# Patient Record
Sex: Male | Born: 2000 | Race: Black or African American | Hispanic: No | Marital: Single | Smoking: Never smoker
Health system: Southern US, Community
[De-identification: ages and names within clinical notes are randomized; demographics above are authoritative.]

## PROBLEM LIST (undated history)

## (undated) HISTORY — PX: OTHER SURGICAL HISTORY: SHX169

---
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
## 2021-10-06 ENCOUNTER — Emergency Department (HOSPITAL_COMMUNITY)
Admission: EM | Admit: 2021-10-06 | Discharge: 2021-10-06 | Disposition: A | Discharge status: Home / Self Care | Payer: Medicaid Other | Attending: Emergency Medicine | Admitting: Emergency Medicine

## 2021-10-06 ENCOUNTER — Other Ambulatory Visit: Payer: Self-pay

## 2021-10-06 ENCOUNTER — Emergency Department (HOSPITAL_COMMUNITY): Payer: Medicaid Other

## 2021-10-06 ENCOUNTER — Encounter (HOSPITAL_COMMUNITY): Payer: Self-pay

## 2021-10-06 DIAGNOSIS — X500XXA Overexertion from strenuous movement or load, initial encounter: Secondary | ICD-10-CM | POA: Diagnosis not present

## 2021-10-06 DIAGNOSIS — M25532 Pain in left wrist: Secondary | ICD-10-CM | POA: Insufficient documentation

## 2021-10-06 DIAGNOSIS — G8911 Acute pain due to trauma: Secondary | ICD-10-CM | POA: Diagnosis not present

## 2021-10-06 NOTE — ED Provider Notes (Signed)
[facility] EMERGENCY DEPARTMENT Provider Note   CSN: [id number] Arrival date & time: 10/06/21  1212     History  Chief Complaint  Patient presents with   Wrist Pain    Henry Quinn is a 21 y.o. male who presents today for evaluation of left wrist pain.  He states that while he was at the gym yesterday and doing curls he felt a twinge of pain in his left wrist.  This is worsened since then.  He states that he does not have any numbness.  The pain is made worse with rotating his wrist.  HPI     Home Medications Prior to Admission medications   Medication Sig Start Date End Date Taking? Authorizing Provider  dicyclomine (BENTYL) 20 MG tablet Take 1 tablet (20 mg total) by mouth 2 (two) times daily. Patient not taking: Reported on 04/25/2019 01/27/16   Antony Madura, PA-C  ondansetron (ZOFRAN ODT) 4 MG disintegrating tablet Take 1 tablet (4 mg total) by mouth every 8 (eight) hours as needed for nausea or vomiting. Patient not taking: Reported on 04/25/2019 01/27/16   Antony Madura, PA-C      Allergies    Patient has no known allergies.    Review of Systems   Review of Systems See HPI Physical Exam Updated Vital Signs BP (!) 141/82 (BP Location: Right Arm)    Pulse 78    Temp 98.6 F (37 C) (Oral)    Resp 17    SpO2 98%  Physical Exam Vitals and nursing note reviewed.  Constitutional:      General: He is not in acute distress. HENT:     Head: Normocephalic and atraumatic.  Cardiovascular:     Rate and Rhythm: Normal rate.     Comments: 2+ left radial pulse. Pulmonary:     Effort: Pulmonary effort is normal. No respiratory distress.  Musculoskeletal:     Cervical back: No rigidity.     Comments: Mild tenderness to palpation over the ulnar aspect of the wrist.  There is no crepitus or deformity noted.  No significant edema.  He is able to flex and extend the wrist and fingers.  He has pain with pronation and supination of the left wrist.  Skin:     General: Skin is warm and dry.     Comments: No abrasions, skin breaks or abnormal coloration noted over the left wrist.  Neurological:     Mental Status: He is alert. Mental status is at baseline.     Sensory: No sensory deficit.     Comments: Awake and alert, answers all questions appropriately.  Speech is not slurred.    Psychiatric:        Mood and Affect: Mood normal.    ED Results / Procedures / Treatments   Labs (all labs ordered are listed, but only abnormal results are displayed) Labs Reviewed - No data to display  EKG None  Radiology DG Wrist Complete Left  Result Date: 10/06/2021 CLINICAL DATA:  Acute left wrist pain after injury. EXAM: LEFT WRIST - COMPLETE 3+ VIEW COMPARISON:  None. FINDINGS: There is no evidence of fracture or dislocation. There is no evidence of arthropathy or other focal bone abnormality. Soft tissues are unremarkable. IMPRESSION: Negative. Electronically Signed   By: Lupita Raider M.D.   On: 10/06/2021 13:08    Procedures Procedures    Medications Ordered in ED Medications - No data to display  ED Course/ Medical Decision Making/ A&P  Medical Decision Making  Patient is a 21 year old who presents today for evaluation of left wrist pain that started after he was curling and felt pain in his left wrist.  X-ray without fracture, dislocation or other abnormality.  He is neurovascularly intact on exam, He is given an Ace wrap. Labs are not indicated. Recommended RICE.  Conservative care.  Work note is given.  He is given hand follow-up if his symptoms fail to improve, instructed on OTC meds.  Return precautions were discussed with patient who states their understanding.  At the time of discharge patient denied any unaddressed complaints or concerns.  Patient is agreeable for discharge home.  Note: Portions of this report may have been transcribed using voice recognition software. Every effort was made to ensure  accuracy; however, inadvertent computerized transcription errors may be present    Final Clinical Impression(s) / ED Diagnoses Final diagnoses:  Left wrist pain    Rx / DC Orders ED Discharge Orders     None         Norman Clay 10/06/21 1623    Gloris Manchester, MD 10/06/21 2006

## 2021-10-06 NOTE — ED Triage Notes (Signed)
Patient states he was at the gym yesterday and his left wrist started hurting. Unable to rotate all the way but can bend it up and down.

## 2021-10-06 NOTE — ED Provider Triage Note (Signed)
Emergency Medicine Provider Triage Evaluation Note  Henry Quinn , a 21 y.o. male  was evaluated in triage.  Pt complains of left wrist pain after lifting at the gym PTA. Patient is right hand dominant.   Review of Systems  Positive: arthralgia Negative: fever  Physical Exam  BP (!) 141/82 (BP Location: Right Arm)    Pulse 78    Temp 98.6 F (37 C) (Oral)    Resp 17    SpO2 98%  Gen:   Awake, no distress   Resp:  Normal effort  MSK:   Moves extremities without difficulty  Other:  Radial pulse intact  Medical Decision Making  Medically screening exam initiated at 12:39 PM.  Appropriate orders placed.  Huntter Oguinn was informed that the remainder of the evaluation will be completed by another provider, this initial triage assessment does not replace that evaluation, and the importance of remaining in the ED until their evaluation is complete.  X-ray to rule out bony fracture   Suzy Bouchard, PA-C 10/06/21 1240

## 2021-10-06 NOTE — Discharge Instructions (Addendum)
You can use the Ace wrap as needed to help with pain and discomfort. I have given you the information for an orthopedic specialist/bone specialist.  If your pain does not get better in the next week please schedule a follow-up appointment with them. I would recommend not using your left arm at the gym for the next week.  After that you can start to slowly return to your usual activities if you are not having pain.  If you have any pain you need to stop doing these activities and allow more time for your wrist to heal.  While in the ED your blood pressure was high.  Please follow up with your primary care doctor or the wellness clinic for repeat evaluation as you may need medication.  High blood pressure can cause long term, potentially serious, damage if left untreated.    Please take Ibuprofen (Advil, motrin) and Tylenol (acetaminophen) to relieve your pain.    You may take up to 600 MG (3 pills) of normal strength ibuprofen every 8 hours as needed.   You make take tylenol, up to 1,000 mg (two extra strength pills) every 8 hours as needed.   It is safe to take ibuprofen and tylenol at the same time as they work differently.   Do not take more than 3,000 mg tylenol in a 24 hour period (not more than one dose every 8 hours.  Please check all medication labels as many medications such as pain and cold medications may contain tylenol.  Do not drink alcohol while taking these medications.  Do not take other NSAID'S while taking ibuprofen (such as aleve or naproxen).  Please take ibuprofen with food to decrease stomach upset.

## 2021-10-07 ENCOUNTER — Telehealth: Payer: Self-pay

## 2021-10-07 NOTE — Telephone Encounter (Signed)
Transition Care Management Follow-up Telephone Call Date of discharge and from where: 10/06/2021-[facility] How have you been since you were released from the hospital? Patient stated her is doing fine.  Any questions or concerns? No  Items Reviewed: Did the pt receive and understand the discharge instructions provided? Yes  Medications obtained and verified? Yes  Other? No  Any new allergies since your discharge? No  Dietary orders reviewed? No Do you have support at home? Yes   Home Care and Equipment/Supplies: Were home health services ordered? not applicable If so, what is the name of the agency? N/A  Has the agency set up a time to come to the patient's home? not applicable Were any new equipment or medical supplies ordered?  No What is the name of the medical supply agency? N/A Were you able to get the supplies/equipment? not applicable Do you have any questions related to the use of the equipment or supplies? No  Functional Questionnaire: (I = Independent and D = Dependent) ADLs: I  Bathing/Dressing- I  Meal Prep- I  Eating- I  Maintaining continence- I  Transferring/Ambulation- I  Managing Meds- I  Follow up appointments reviewed:  PCP Hospital f/u appt confirmed? No   Specialist Hospital f/u appt confirmed? No   Are transportation arrangements needed? No  If their condition worsens, is the pt aware to call PCP or go to the Emergency Dept.? Yes Was the patient provided with contact information for the PCP's office or ED? Yes Was to pt encouraged to call back with questions or concerns? Yes

## 2021-11-08 ENCOUNTER — Ambulatory Visit (INDEPENDENT_AMBULATORY_CARE_PROVIDER_SITE_OTHER): Payer: Medicaid Other

## 2021-11-08 ENCOUNTER — Emergency Department (HOSPITAL_BASED_OUTPATIENT_CLINIC_OR_DEPARTMENT_OTHER)
Admission: EM | Admit: 2021-11-08 | Discharge: 2021-11-08 | Disposition: A | Discharge status: Home / Self Care | Payer: Medicaid Other | Attending: Emergency Medicine | Admitting: Emergency Medicine

## 2021-11-08 ENCOUNTER — Encounter (HOSPITAL_BASED_OUTPATIENT_CLINIC_OR_DEPARTMENT_OTHER): Payer: Self-pay | Admitting: Emergency Medicine

## 2021-11-08 ENCOUNTER — Encounter (HOSPITAL_COMMUNITY): Payer: Self-pay | Admitting: *Deleted

## 2021-11-08 ENCOUNTER — Ambulatory Visit (HOSPITAL_COMMUNITY)
Admission: EM | Admit: 2021-11-08 | Discharge: 2021-11-08 | Disposition: A | Discharge status: Home / Self Care | Payer: Medicaid Other | Attending: Family Medicine | Admitting: Family Medicine

## 2021-11-08 ENCOUNTER — Other Ambulatory Visit: Payer: Self-pay

## 2021-11-08 DIAGNOSIS — R0689 Other abnormalities of breathing: Secondary | ICD-10-CM | POA: Diagnosis not present

## 2021-11-08 DIAGNOSIS — R072 Precordial pain: Secondary | ICD-10-CM | POA: Diagnosis not present

## 2021-11-08 DIAGNOSIS — R079 Chest pain, unspecified: Secondary | ICD-10-CM | POA: Diagnosis not present

## 2021-11-08 DIAGNOSIS — M25512 Pain in left shoulder: Secondary | ICD-10-CM | POA: Diagnosis not present

## 2021-11-08 DIAGNOSIS — R61 Generalized hyperhidrosis: Secondary | ICD-10-CM | POA: Insufficient documentation

## 2021-11-08 DIAGNOSIS — X500XXA Overexertion from strenuous movement or load, initial encounter: Secondary | ICD-10-CM | POA: Diagnosis not present

## 2021-11-08 LAB — BASIC METABOLIC PANEL
Anion gap: 11 (ref 5–15)
BUN: 17 mg/dL (ref 6–20)
CO2: 25 mmol/L (ref 22–32)
Calcium: 9.5 mg/dL (ref 8.9–10.3)
Chloride: 102 mmol/L (ref 98–111)
Creatinine, Ser: 1.05 mg/dL (ref 0.61–1.24)
GFR, Estimated: >60 mL/min (ref >60)
Glucose, Bld: 90 mg/dL (ref 70–99)
Potassium: 3.6 mmol/L (ref 3.5–5.1)
Sodium: 138 mmol/L (ref 135–145)

## 2021-11-08 LAB — CBC
HCT: 47.2 % (ref 39.0–52.0)
Hemoglobin: 15.2 g/dL (ref 13.0–17.0)
MCH: 27.7 pg (ref 26.0–34.0)
MCHC: 32.2 g/dL (ref 30.0–36.0)
MCV: 86 fL (ref 80.0–100.0)
Platelets: 192 10*3/uL (ref 150–400)
RBC: 5.49 MIL/uL (ref 4.22–5.81)
RDW: 13.3 % (ref 11.5–15.5)
WBC: 8.3 10*3/uL (ref 4.0–10.5)
nRBC: 0 % (ref 0.0–0.2)

## 2021-11-08 LAB — TROPONIN I (HIGH SENSITIVITY)
Troponin I (High Sensitivity): <2 ng/L (ref <18)
Troponin I (High Sensitivity): <2 ng/L (ref <18)

## 2021-11-08 MED ORDER — LIDOCAINE 5 % EX PTCH
1.0000 | MEDICATED_PATCH | CUTANEOUS | Status: DC
  Administered 2021-11-08: 1 via TRANSDERMAL
  Filled 2021-11-08: qty 1

## 2021-11-08 MED ORDER — LIDOCAINE 4 % EX PTCH: 1.0000 | MEDICATED_PATCH | Freq: Two times a day (BID) | CUTANEOUS | 0 refills | Status: DC | PRN

## 2021-11-08 MED ORDER — METHOCARBAMOL 500 MG PO TABS: 500.0000 mg | ORAL_TABLET | Freq: Three times a day (TID) | ORAL | 0 refills | Status: DC | PRN

## 2021-11-08 MED ORDER — AEROCHAMBER PLUS FLO-VU LARGE MISC
1.0000 | Freq: Once | Status: AC
  Administered 2021-11-08: 1
  Filled 2021-11-08: qty 1

## 2021-11-08 MED ORDER — ALBUTEROL SULFATE HFA 108 (90 BASE) MCG/ACT IN AERS
2.0000 | INHALATION_SPRAY | Freq: Once | RESPIRATORY_TRACT | Status: AC
  Administered 2021-11-08: 2 via RESPIRATORY_TRACT
  Filled 2021-11-08: qty 6.7

## 2021-11-08 MED ORDER — IPRATROPIUM-ALBUTEROL 0.5-2.5 (3) MG/3ML IN SOLN
RESPIRATORY_TRACT | Status: AC
  Administered 2021-11-08: 3 mL via RESPIRATORY_TRACT
  Filled 2021-11-08: qty 3

## 2021-11-08 MED ORDER — KETOROLAC TROMETHAMINE 15 MG/ML IJ SOLN
15.0000 mg | Freq: Once | INTRAMUSCULAR | Status: AC
  Administered 2021-11-08: 15 mg via INTRAMUSCULAR
  Filled 2021-11-08: qty 1

## 2021-11-08 MED ORDER — IPRATROPIUM-ALBUTEROL 0.5-2.5 (3) MG/3ML IN SOLN
3.0000 mL | Freq: Once | RESPIRATORY_TRACT | Status: AC
  Administered 2021-11-08: 3 mL via RESPIRATORY_TRACT
  Filled 2021-11-08: qty 3

## 2021-11-08 MED ORDER — IPRATROPIUM-ALBUTEROL 0.5-2.5 (3) MG/3ML IN SOLN: 3.0000 mL | Freq: Once | RESPIRATORY_TRACT | Status: AC

## 2021-11-08 NOTE — ED Triage Notes (Signed)
Pt reports LT side CP that radiates to Lt arm . Pt reports CP is worse with movement. CP started this AM.

## 2021-11-08 NOTE — ED Provider Notes (Signed)
[facility] EMERGENCY DEPT Provider Note   CSN: [id number] Arrival date & time: 11/08/21  1919     History  Chief Complaint  Patient presents with   Chest Pain    Henry Quinn is a 21 y.o. male with no pertinent past medical history.  Presents emergency department with a chief complaint of chest pain.  Patient reports that he woke up this morning at 4 AM and noticed pain to the left side of his chest.  Patient describes pain as an ache.  Pain has been constant since its onset.  Patient endorses diaphoresis when pain started.  Describes pain as an ache.  Patient also complains of pain to his left shoulder.  Denies any associated nausea, vomiting, or shortness of breath.  Patient reports that pain is worse when walking around.  Patient denies any palpitations, leg swelling or tenderness, hemoptysis, lightheadedness, syncope, nausea, vomiting, fevers, chills, abdominal pain, nausea, vomiting, blood in stool, melena.  Patient endorses vaping CBD.  Denies any illicit drug use, tobacco use, or alcohol use.  No history of prior PE/DVT, surgery last 4 weeks, hormone therapy.  Patient endorses heavy lifting at his job.   Chest Pain Associated symptoms: diaphoresis   Associated symptoms: no abdominal pain, no back pain, no dizziness, no fever, no headache, no nausea, no palpitations, no shortness of breath and no vomiting       Home Medications Prior to Admission medications   Not on File      Allergies    Patient has no known allergies.    Review of Systems   Review of Systems  Constitutional:  Positive for diaphoresis. Negative for chills and fever.  Eyes:  Negative for visual disturbance.  Respiratory:  Negative for shortness of breath.   Cardiovascular:  Positive for chest pain. Negative for palpitations and leg swelling.  Gastrointestinal:  Negative for abdominal pain, nausea and vomiting.  Musculoskeletal:  Negative for back pain and neck pain.  Skin:   Negative for color change and rash.  Neurological:  Negative for dizziness, syncope, light-headedness and headaches.  Psychiatric/Behavioral:  Negative for confusion.    Physical Exam Updated Vital Signs BP 133/65    Pulse (!) 111    Temp 99 F (37.2 C) (Oral)    Resp 19    Wt 117.9 kg    SpO2 100%  Physical Exam Vitals and nursing note reviewed.  Constitutional:      General: He is not in acute distress.    Appearance: He is not ill-appearing, toxic-appearing or diaphoretic.  HENT:     Head: Normocephalic.  Eyes:     General: No scleral icterus.       Right eye: No discharge.        Left eye: No discharge.  Cardiovascular:     Rate and Rhythm: Normal rate.     Pulses:          Radial pulses are 2+ on the right side and 2+ on the left side.  Pulmonary:     Effort: Pulmonary effort is normal. No tachypnea, bradypnea, prolonged expiration or respiratory distress.     Breath sounds: No stridor. Examination of the right-lower field reveals decreased breath sounds. Examination of the left-lower field reveals decreased breath sounds. Decreased breath sounds present. No wheezing, rhonchi or rales.  Chest:     Chest wall: No mass, deformity, swelling, tenderness, crepitus or edema.  Musculoskeletal:     Right lower leg: No edema.     Left  lower leg: No edema.  Skin:    General: Skin is warm and dry.  Neurological:     General: No focal deficit present.     Mental Status: He is alert.  Psychiatric:        Behavior: Behavior is cooperative.    ED Results / Procedures / Treatments   Labs (all labs ordered are listed, but only abnormal results are displayed) Labs Reviewed  BASIC METABOLIC PANEL  CBC  TROPONIN I (HIGH SENSITIVITY)  TROPONIN I (HIGH SENSITIVITY)    EKG EKG Interpretation  Date/Time:  Sunday November 08 2021 19:55:29 EST Ventricular Rate:  82 PR Interval:  146 QRS Duration: 109 QT Interval:  349 QTC Calculation: 408 R Axis:   21 Text Interpretation: Sinus  rhythm Consider left atrial enlargement RSR' in V1 or V2, probably normal variant ST elev, probable normal early repol pattern Baseline wander in lead(s) V3 no ECG prior to today present in system. No STEMI Confirmed by Antony Blackbird [phone]) on 11/08/2021 8:29:21 PM  Radiology DG Chest 2 View  Result Date: 11/08/2021 CLINICAL DATA:  Left chest pain EXAM: CHEST - 2 VIEW COMPARISON:  None. FINDINGS: The heart size and mediastinal contours are within normal limits. Both lungs are clear. The visualized skeletal structures are unremarkable. IMPRESSION: Normal study Electronically Signed   By: Rolm Baptise M.D.   On: 11/08/2021 18:45    Procedures Procedures    Medications Ordered in ED Medications  ketorolac (TORADOL) 15 MG/ML injection 15 mg (has no administration in time range)  ipratropium-albuterol (DUONEB) 0.5-2.5 (3) MG/3ML nebulizer solution 3 mL (3 mLs Nebulization Given 11/08/21 2051)  ipratropium-albuterol (DUONEB) 0.5-2.5 (3) MG/3ML nebulizer solution 3 mL (3 mLs Nebulization Given 11/08/21 2102)    ED Course/ Medical Decision Making/ A&P                           Medical Decision Making Amount and/or Complexity of Data Reviewed Labs: ordered.  Risk OTC drugs. Prescription drug management.   Alert 21 year old male no acute distress, nontoxic-appearing.  Presents to the emergency department with a chief complaint of chest pain.  Information was obtained from patient and patient's mother at bedside.  Past medical records were reviewed including previous provider notes and labs.  Patient presents emergency department with a chief complaint of chest pain.  Due to patient's reports of chest pain ACS work-up initiated.  Obtaining D-dimer and/or CTA of chest to evaluate for PE was considered however patient can be ruled out for PE based on PERC criteria.  Patient was noted to have 1 episode of tachycardia however this occurred after patient received albuterol neb treatment.  Suspect  that patient's tachycardia secondary to albuterol.  Patient noted to have decreased breath sounds to lower lobes bilaterally.  Patient received DuoNeb x2 with improvement in lung sounds.  Lab testing was independently interpreted by myself.  Pertinent findings include: -Troponin less than 2 with delta of 0 -BMP and CBC unremarkable  EKG was independently interpreted by myself.  EKG shows sinus rhythm with early repolarization pattern.  Chest x-ray was not ordered in the emergency department as he had this obtained at urgent care prior to arrival.  Chest x-ray was independently interpreted by myself and shows no acute cardiopulmonary process.  Patient has heart score of 3.  With negative delta troponin and EKG showing no signs of STEMI low suspicion for ACS at this time.  With patient's pain being worse with  movement and performing heavy lifting in his job suspect possible musculoskeletal injury.  Patient was given Toradol with improvement in his pain.  Patient hemodynamically stable at this time.  Will prescribe patient with lidocaine patch as well as short course of Robaxin.  Patient advised to follow-up with PCP in outpatient setting for repeat evaluation.  Discussed results, findings, treatment and follow up. Patient and patient's parent advised of return precautions. Patient and patient's parent verbalized understanding and agreed with plan.         Final Clinical Impression(s) / ED Diagnoses Final diagnoses:  Precordial chest pain    Rx / DC Orders ED Discharge Orders          Ordered    methocarbamol (ROBAXIN) 500 MG tablet  Every 8 hours PRN        11/08/21 2220    Lidocaine (HM LIDOCAINE PATCH) 4 % PTCH  Every 12 hours PRN        11/08/21 2220              Loni Beckwith, PA-C 11/09/21 0200    Tegeler, Gwenyth Allegra, MD 11/09/21 2025

## 2021-11-08 NOTE — Discharge Instructions (Addendum)
You came to the emergency department today to be evaluated for your chest pain.  Your physical exam and lab work were reassuring.  The pain you are experiencing may be musculoskeletal in nature.  Due to this you were given a prescription for lidocaine patches and muscle relaxers.  Please use these as prescribed.  You received Toradol in the emergency department, due to this please do not take ibuprofen, Motrin or other NSAIDs for the next 24 hours.  After that please take Tylenol and ibuprofen as indicated below.  Today you were prescribed Methocarbamol (Robaxin).  Methocarbamol (Robaxin) is used to treat muscle spasms/pain.  It works by helping to relax the muscles.  Drowsiness, dizziness, lightheadedness, stomach upset, nausea/vomiting, or blurred vision may occur.  Do not drive, use machinery, or do anything that needs alertness or clear vision until you can do it safely.  Do not combine this medication with alcoholic beverages, marijuana, or other central nervous system depressants.    Please take Ibuprofen (Advil, motrin) and Tylenol (acetaminophen) to relieve your pain.    You may take up to 600 MG (3 pills) of normal strength ibuprofen every 8 hours as needed.   You make take tylenol, up to 1,000 mg (two extra strength pills) every 8 hours as needed.   It is safe to take ibuprofen and tylenol at the same time as they work differently.   Do not take more than 3,000 mg tylenol in a 24 hour period (not more than one dose every 8 hours.  Please check all medication labels as many medications such as pain and cold medications may contain tylenol.  Do not drink alcohol while taking these medications.  Do not take other NSAID'S while taking ibuprofen (such as aleve or naproxen).  Please take ibuprofen with food to decrease stomach upset.  Get help right away if: Your chest pain gets worse. You have a cough that gets worse, or you cough up blood. You have severe pain in your abdomen. You faint. You  have sudden, unexplained chest discomfort. You have sudden, unexplained discomfort in your arms, back, neck, or jaw. You have shortness of breath at any time. You suddenly start to sweat, or your skin gets clammy. You feel nausea or you vomit. You suddenly feel lightheaded or dizzy. You have severe weakness, or unexplained weakness or fatigue. Your heart begins to beat quickly, or it feels like it is skipping beats.

## 2021-11-08 NOTE — ED Notes (Signed)
Patient is being discharged from the Urgent Care and sent to the Emergency Department via pov . Per dr Windy Carina, patient is in need of higher level of care due to presentation, chest pain. Patient is aware and verbalizes understanding of plan of care. There were no vitals filed for this visit.

## 2021-11-08 NOTE — ED Notes (Signed)
Discharge instructions discussed with pt and mother at bedside. Pt was able to verbalize understanding with no questions at this time.  

## 2021-11-08 NOTE — ED Triage Notes (Signed)
Constant chest dull pain , left arm pain , sinus congestion and headache , denies shortness of breath nor cough .

## 2021-11-08 NOTE — ED Provider Notes (Signed)
[facility]    CSN: [id number] Arrival date & time: 11/08/21  1632      History   Chief Complaint Chief Complaint  Patient presents with   Chest Pain    HPI Henry Quinn is a 21 y.o. male.    Chest Pain With left chest pain that began this morning.  It hurts to move.  He states that actually if he is walking around that that is when it hurts.  It also hurts when he moves his arm.  Mom here with him and says that he is also having a headache.  No fever documented at home, but he did have some chills earlier this morning.  No cough noted but he has had some mild nasal congestion beginning today also.  No vomiting or diarrhea   Family history is significant for coronary artery disease in his dad prior to the age of 67  History reviewed. No pertinent past medical history.  There are no problems to display for this patient.   Past Surgical History:  Procedure Laterality Date   left hip surgery Left        Home Medications    Prior to Admission medications   Not on File    Family History History reviewed. No pertinent family history.  Social History Social History   Tobacco Use   Smoking status: Never     Allergies   Patient has no known allergies.   Review of Systems Review of Systems  Cardiovascular:  Positive for chest pain.    Physical Exam Triage Vital Signs ED Triage Vitals [11/08/21 1723]  Enc Vitals Group     BP      Pulse      Resp      Temp      Temp src      SpO2      Weight      Height      Head Circumference      Peak Flow      Pain Score 5     Pain Loc      Pain Edu?      Excl. in GC?    No data found.  Updated Vital Signs There were no vitals taken for this visit.  Visual Acuity Right Eye Distance:   Left Eye Distance:   Bilateral Distance:    Right Eye Near:   Left Eye Near:    Bilateral Near:     Physical Exam Vitals reviewed.  Constitutional:      General: He is not in acute distress.     Appearance: He is not ill-appearing, toxic-appearing or diaphoretic.  HENT:     Right Ear: Tympanic membrane and ear canal normal.     Left Ear: Tympanic membrane and ear canal normal.     Nose: Nose normal.     Mouth/Throat:     Mouth: Mucous membranes are moist.     Pharynx: No oropharyngeal exudate or posterior oropharyngeal erythema.  Eyes:     Extraocular Movements: Extraocular movements intact.     Conjunctiva/sclera: Conjunctivae normal.     Pupils: Pupils are equal, round, and reactive to light.  Cardiovascular:     Rate and Rhythm: Normal rate and regular rhythm.     Heart sounds: No murmur heard. Pulmonary:     Effort: Pulmonary effort is normal.     Breath sounds: Normal breath sounds. No stridor. No wheezing, rhonchi or rales.  Musculoskeletal:     Cervical  back: Neck supple.     Right lower leg: No edema.     Left lower leg: No edema.  Lymphadenopathy:     Cervical: No cervical adenopathy.  Skin:    Capillary Refill: Capillary refill takes less than 2 seconds.     Coloration: Skin is not jaundiced or pale.  Neurological:     General: No focal deficit present.     Mental Status: He is alert and oriented to person, place, and time.  Psychiatric:        Behavior: Behavior normal.     UC Treatments / Results  Labs (all labs ordered are listed, but only abnormal results are displayed) Labs Reviewed - No data to display  EKG   Radiology DG Chest 2 View  Result Date: 11/08/2021 CLINICAL DATA:  Left chest pain EXAM: CHEST - 2 VIEW COMPARISON:  None. FINDINGS: The heart size and mediastinal contours are within normal limits. Both lungs are clear. The visualized skeletal structures are unremarkable. IMPRESSION: Normal study Electronically Signed   By: Charlett Nose M.D.   On: 11/08/2021 18:45    Procedures Procedures (including critical care time)  Medications Ordered in UC Medications - No data to display  Initial Impression / Assessment and Plan / UC Course   I have reviewed the triage vital signs and the nursing notes.  Pertinent labs & imaging results that were available during my care of the patient were reviewed by me and considered in my medical decision making (see chart for details).     EKG is unrevealing, with no ST segment elevation or depression.  It is normal sinus rhythm.  Chest x-ray also was normal.  When I give the patient and his mom the results she is incredulous that he is hurting this much with everything normal. I did discuss that there is possibly new onset of  a viral illness (that could be causing the chills or nasal congestion). We discussed possibly needing evaluation for PE, so they are planning to proceed to the ER for that Final Clinical Impressions(s) / UC Diagnoses   Final diagnoses:  Chest pain, unspecified type     Discharge Instructions      Please proceed to the emergency room for further evaluation     ED Prescriptions   None    PDMP not reviewed this encounter.   Zenia Resides, MD 11/08/21 1900

## 2021-11-08 NOTE — Discharge Instructions (Addendum)
Please proceed to the emergency room for further evaluation 

## 2021-11-08 NOTE — ED Notes (Signed)
RT educated pt on proper use of MDI w/spacer. Pt able to perform w/out difficulty. Pt verbalizes understanding of process/administration/education. Pt also given information on pulmonologist for PFT per request.

## 2021-11-09 ENCOUNTER — Telehealth: Payer: Self-pay

## 2021-11-09 DIAGNOSIS — Z9189 Other specified personal risk factors, not elsewhere classified: Secondary | ICD-10-CM

## 2021-11-09 NOTE — Telephone Encounter (Signed)
Transition Care Management Unsuccessful Follow-up Telephone Call  Date of discharge and from where:  11/09/2021-[facility]   Attempts:  1st Attempt  Reason for unsuccessful TCM follow-up call:  Left voice message

## 2021-11-10 NOTE — Addendum Note (Signed)
Addended by: Victorio Palm on: 11/10/2021 03:06 PM   Modules accepted: Orders

## 2021-11-10 NOTE — Telephone Encounter (Signed)
Transition Care Management Follow-up Telephone Call Date of discharge and from where: 11/09/2021-[facility]  How have you been since you were released from the hospital? Pt stated he is doing fine but has not picked up his medications yet.  Any questions or concerns? No  Items Reviewed: Did the pt receive and understand the discharge instructions provided? Yes  Medications obtained and verified? Yes  Other? No  Any new allergies since your discharge? No  Dietary orders reviewed? No Do you have support at home? Yes   Home Care and Equipment/Supplies: Were home health services ordered? not applicable If so, what is the name of the agency? N/A  Has the agency set up a time to come to the patient's home? not applicable Were any new equipment or medical supplies ordered?  No What is the name of the medical supply agency? N/A Were you able to get the supplies/equipment? not applicable Do you have any questions related to the use of the equipment or supplies? No  Functional Questionnaire: (I = Independent and D = Dependent) ADLs: I  Bathing/Dressing- I  Meal Prep- I  Eating- I  Maintaining continence- I  Transferring/Ambulation- I  Managing Meds- I  Follow up appointments reviewed:  PCP Hospital f/u appt confirmed? No   Specialist Hospital f/u appt confirmed? No   Are transportation arrangements needed? No  If their condition worsens, is the pt aware to call PCP or go to the Emergency Dept.? Yes Was the patient provided with contact information for the PCP's office or ED? Yes Was to pt encouraged to call back with questions or concerns? Yes

## 2021-11-11 ENCOUNTER — Telehealth: Payer: Self-pay

## 2021-11-11 NOTE — Telephone Encounter (Signed)
? ?  Telephone encounter was:  Unsuccessful.  11/11/2021 ?Name: Henry Quinn MRN: [id number] DOB: [birth year] ? ?Unsuccessful outbound call made today to assist with:   pcp ? ?Outreach Attempt:  1st Attempt ?no answer and could not leave a message ? ? ? ?Lenard Forth ?Care Guide, Embedded Care Coordination ?[place], Care Management  ?[phone] ?[address] [place], [place], [place] [address] ?Phone: [phone] ?Email: [email or web address] ? ?  ?

## 2021-11-12 ENCOUNTER — Telehealth: Payer: Self-pay

## 2021-11-12 NOTE — Telephone Encounter (Signed)
? ?  Telephone encounter was:  Unsuccessful.  11/12/2021 ?Name: Henry Quinn MRN: [id number] DOB: 2001/04/09 ? ?Unsuccessful outbound call made today to assist with:   PCP ? ?Outreach Attempt:  2nd Attempt ?could not leave a message ? ? ? ?Lenard Forth ?Care Guide, Embedded Care Coordination ?[place], Care Management  ?[phone] ?[address] [place], [place], [place] [address] ?Phone: [phone] ?Email: [email or web address] ? ?  ?

## 2021-11-13 ENCOUNTER — Telehealth: Payer: Self-pay

## 2021-11-13 NOTE — Telephone Encounter (Signed)
? ?  Telephone encounter was:  Unsuccessful.  11/13/2021 ?Name: Henry Quinn MRN: [id number] DOB: September 09, 2001 ? ?Unsuccessful outbound call made today to assist with:   pcp ? ?Outreach Attempt:  3rd Attempt.  Referral closed unable to contact patient. ? ? ? ? ?Lenard Forth ?Care Guide, Embedded Care Coordination ?[place], Care Management  ?[phone] ?[address] [place], [place], [place] [address] ?Phone: [phone] ?Email: [email or web address] ? ?  ?

## 2022-08-05 IMAGING — DX DG WRIST COMPLETE 3+V*L*
4 series · 4 of 4 positions shown · non-contrast
Comparison: None.

CLINICAL DATA: Acute left wrist pain after injury.

EXAM:
LEFT WRIST - COMPLETE 3+ VIEW

[x wrist pa left]
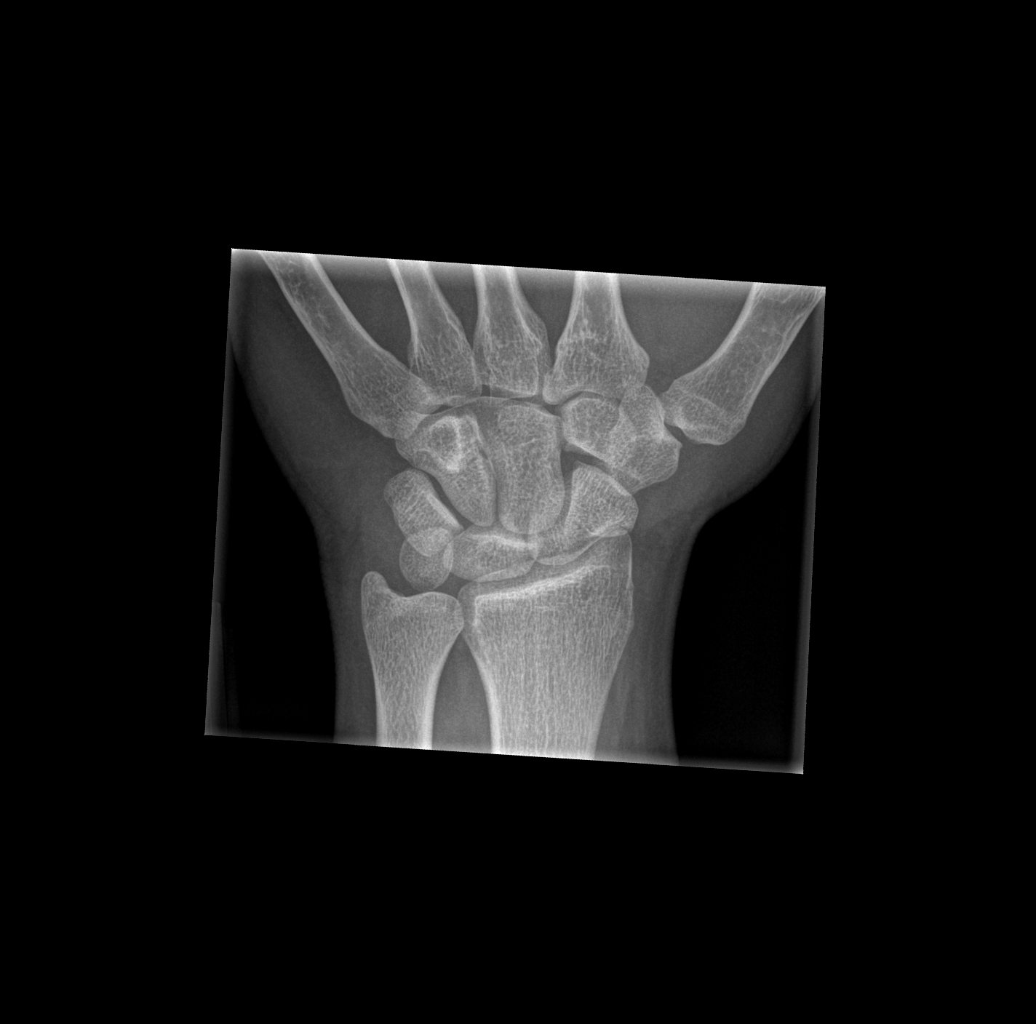

[x wrist obl left]
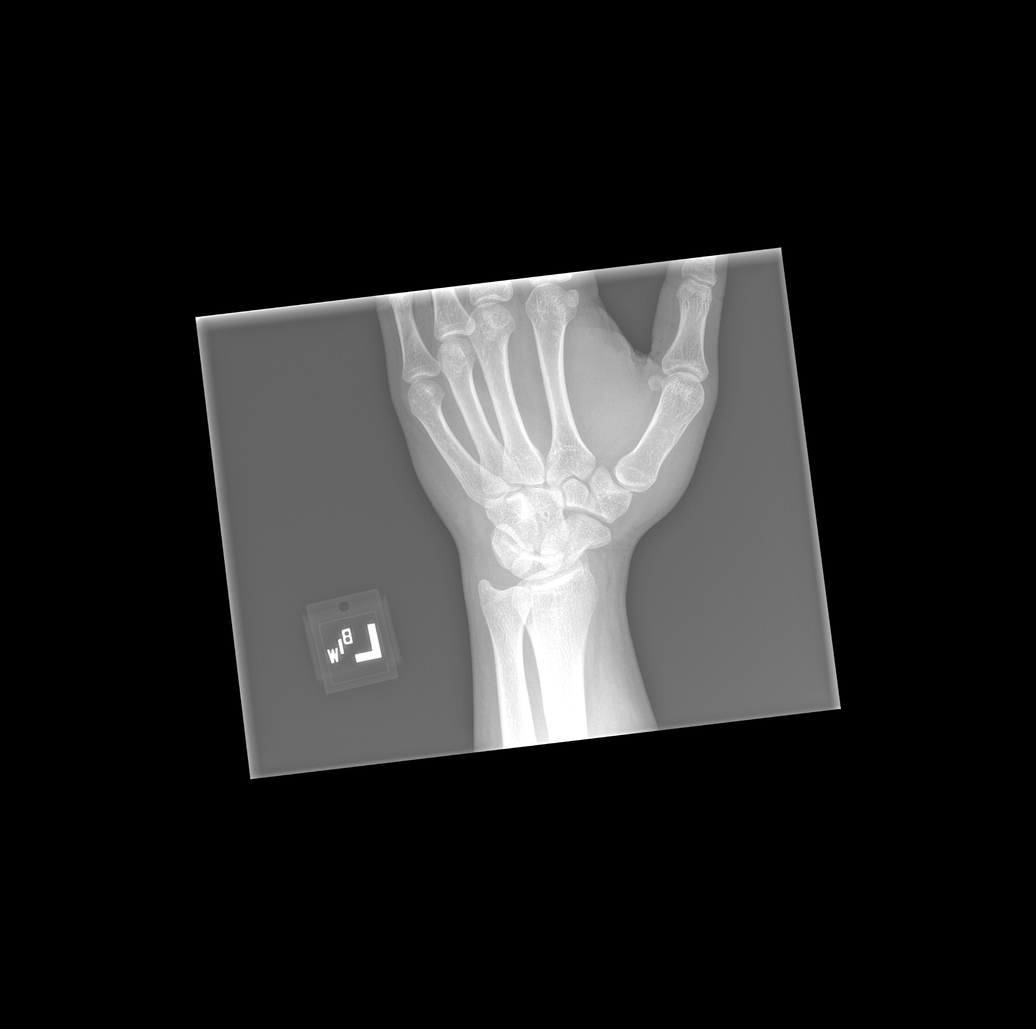

[x wrist lat left]
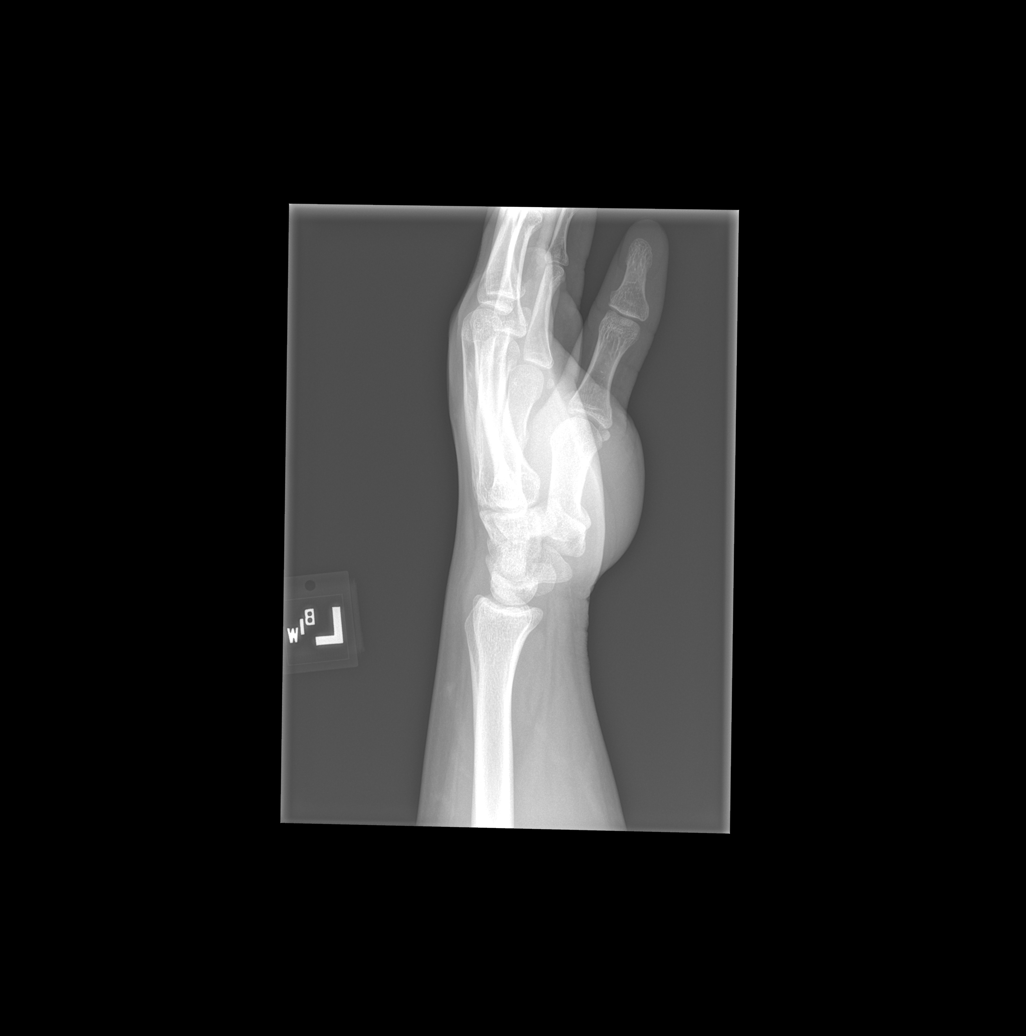

[x wrist navicular view left]
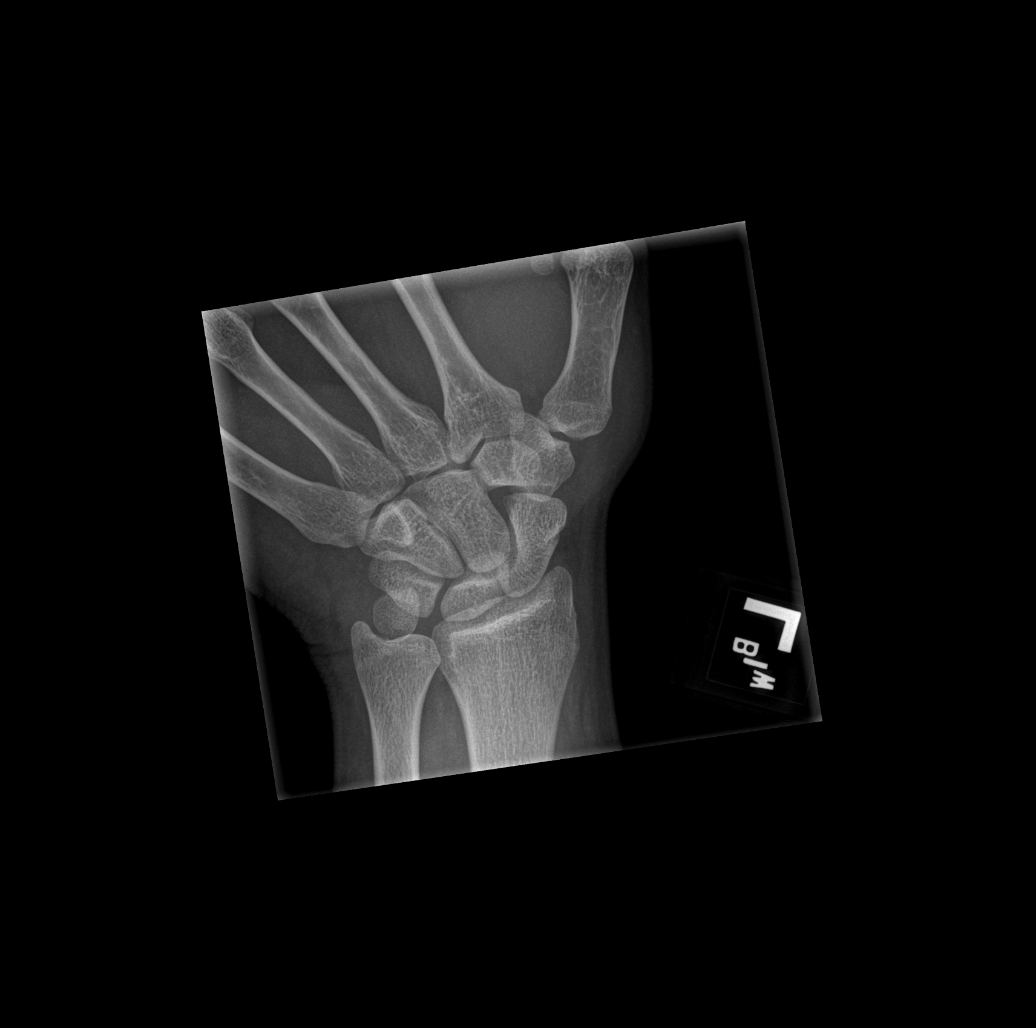

[4 of 4 positions shown; findings below may reference images not displayed]

FINDINGS: There is no evidence of fracture or dislocation. There is no
evidence of arthropathy or other focal bone abnormality. Soft
tissues are unremarkable.
IMPRESSION: Negative.

## 2022-09-25 ENCOUNTER — Ambulatory Visit (HOSPITAL_COMMUNITY)
Admission: EM | Admit: 2022-09-25 | Discharge: 2022-09-25 | Disposition: A | Discharge status: Home / Self Care | Payer: Medicaid Other | Attending: Family Medicine | Admitting: Family Medicine

## 2022-09-25 ENCOUNTER — Encounter (HOSPITAL_COMMUNITY): Payer: Self-pay

## 2022-09-25 DIAGNOSIS — R202 Paresthesia of skin: Secondary | ICD-10-CM | POA: Diagnosis not present

## 2022-09-25 MED ORDER — PREDNISONE 20 MG PO TABS: 40.0000 mg | ORAL_TABLET | Freq: Every day | ORAL | 0 refills | Status: DC

## 2022-09-25 NOTE — ED Triage Notes (Signed)
Pt states his lips became numb after using peroxide for dental pain x 3days

## 2022-09-27 NOTE — ED Provider Notes (Signed)
  [place]   [id number] 09/25/22 Arrival Time: 1618  ASSESSMENT & PLAN:  1. Paresthesia of lower lip    Likely allergic process; discussed. No worrisome findings on exam. He prefers trial of prednisone. No swallowing/resp difficulties.  Discharge Medication List as of 09/25/2022  5:22 PM     START taking these medications   Details  predniSONE (DELTASONE) 20 MG tablet Take 2 tablets (40 mg total) by mouth daily., Starting Sat 09/25/2022, Normal         Follow-up Information     [facility] at [facility].   Specialty: Urgent Care Why: If worsening or failing to improve as anticipated. Contact information: [place] [phone] [place].   Specialty: Emergency Medicine Why: If symptoms worsen in any way. Contact information: [address] [phone] [place] [phone]                Reviewed expectations re: course of current medical issues. Questions answered. Outlined signs and symptoms indicating need for more acute intervention. Understanding verbalized. After Visit Summary given.   SUBJECTIVE: History from: Patient. Henry Quinn is a 22 y.o. male. Reports: "tingling feeling" in lips after rinsing his mouth multiple times with H2O2 2-3 d ago; dental pain that is now better. Denies: difficulty breathing. No swallowing difficulties. Normal PO intake without n/v/d.  OBJECTIVE:  Vitals:   09/25/22 1712  BP: (!) 123/91  Pulse: 99  Resp: 16  Temp: 99.4 F (37.4 C)  TempSrc: Oral  SpO2: 100%    General appearance: alert; no distress Eyes: PERRLA; EOMI; conjunctiva normal HENT: [place]; AT; without nasal congestion; lips look a little irritated without significant swelling; tongue normal Neck: supple  Lungs: speaks full sentences without difficulty; unlabored Extremities: no edema Skin: warm  and dry Neurologic: normal gait Psychological: alert and cooperative; normal mood and affect    No Known Allergies  History reviewed. No pertinent past medical history. Social History   Socioeconomic History   Marital status: Single    Spouse name: Not on file   Number of children: Not on file   Years of education: Not on file   Highest education level: Not on file  Occupational History   Not on file  Tobacco Use   Smoking status: Never   Smokeless tobacco: Not on file  Substance and Sexual Activity   Alcohol use: Not on file   Drug use: Not on file   Sexual activity: Not on file  Other Topics Concern   Not on file  Social History Narrative   Not on file   Social Determinants of Health   Financial Resource Strain: Not on file  Food Insecurity: Not on file  Transportation Needs: Not on file  Physical Activity: Not on file  Stress: Not on file  Social Connections: Not on file  Intimate Partner Violence: Not on file   History reviewed. No pertinent family history. Past Surgical History:  Procedure Laterality Date   left hip surgery Left      Vanessa Kick, MD 09/27/22 [phone]

## 2022-10-02 ENCOUNTER — Other Ambulatory Visit: Payer: Self-pay

## 2022-10-02 ENCOUNTER — Encounter (HOSPITAL_COMMUNITY): Payer: Self-pay | Admitting: Emergency Medicine

## 2022-10-02 ENCOUNTER — Emergency Department (HOSPITAL_COMMUNITY)
Admission: EM | Admit: 2022-10-02 | Discharge: 2022-10-02 | Disposition: A | Discharge status: Home / Self Care | Payer: Medicaid Other | Attending: Emergency Medicine | Admitting: Emergency Medicine

## 2022-10-02 ENCOUNTER — Emergency Department (HOSPITAL_COMMUNITY): Payer: Medicaid Other

## 2022-10-02 DIAGNOSIS — K047 Periapical abscess without sinus: Secondary | ICD-10-CM | POA: Diagnosis not present

## 2022-10-02 DIAGNOSIS — R220 Localized swelling, mass and lump, head: Secondary | ICD-10-CM

## 2022-10-02 DIAGNOSIS — D72829 Elevated white blood cell count, unspecified: Secondary | ICD-10-CM | POA: Diagnosis not present

## 2022-10-02 LAB — CBC WITH DIFFERENTIAL/PLATELET
Abs Immature Granulocytes: 0.04 10*3/uL (ref 0.00–0.07)
Basophils Absolute: 0.1 10*3/uL (ref 0.0–0.1)
Basophils Relative: 1 %
Eosinophils Absolute: 0.2 10*3/uL (ref 0.0–0.5)
Eosinophils Relative: 1 %
HCT: 43.7 % (ref 39.0–52.0)
Hemoglobin: 14.2 g/dL (ref 13.0–17.0)
Immature Granulocytes: 0 %
Lymphocytes Relative: 14 %
Lymphs Abs: 1.6 10*3/uL (ref 0.7–4.0)
MCH: 27.8 pg (ref 26.0–34.0)
MCHC: 32.5 g/dL (ref 30.0–36.0)
MCV: 85.5 fL (ref 80.0–100.0)
Monocytes Absolute: 1.3 10*3/uL — ABNORMAL HIGH (ref 0.1–1.0)
Monocytes Relative: 11 %
Neutro Abs: 8.8 10*3/uL — ABNORMAL HIGH (ref 1.7–7.7)
Neutrophils Relative %: 73 %
Platelets: 339 10*3/uL (ref 150–400)
RBC: 5.11 MIL/uL (ref 4.22–5.81)
RDW: 12.7 % (ref 11.5–15.5)
WBC: 12.1 10*3/uL — ABNORMAL HIGH (ref 4.0–10.5)
nRBC: 0 % (ref 0.0–0.2)

## 2022-10-02 LAB — BASIC METABOLIC PANEL
Anion gap: 9 (ref 5–15)
BUN: 9 mg/dL (ref 6–20)
CO2: 27 mmol/L (ref 22–32)
Calcium: 9.1 mg/dL (ref 8.9–10.3)
Chloride: 102 mmol/L (ref 98–111)
Creatinine, Ser: 1.1 mg/dL (ref 0.61–1.24)
GFR, Estimated: >60 mL/min (ref >60)
Glucose, Bld: 95 mg/dL (ref 70–99)
Potassium: 3.6 mmol/L (ref 3.5–5.1)
Sodium: 138 mmol/L (ref 135–145)

## 2022-10-02 MED ORDER — CLINDAMYCIN PHOSPHATE 900 MG/50ML IV SOLN
900.0000 mg | Freq: Once | INTRAVENOUS | Status: AC
  Administered 2022-10-02: 900 mg via INTRAVENOUS
  Filled 2022-10-02: qty 50

## 2022-10-02 MED ORDER — KETOROLAC TROMETHAMINE 15 MG/ML IJ SOLN
15.0000 mg | Freq: Once | INTRAMUSCULAR | Status: AC
  Administered 2022-10-02: 15 mg via INTRAVENOUS
  Filled 2022-10-02: qty 1

## 2022-10-02 MED ORDER — HYDROCODONE-ACETAMINOPHEN 5-325 MG PO TABS
1.0000 | ORAL_TABLET | Freq: Once | ORAL | Status: AC
  Administered 2022-10-02: 1 via ORAL
  Filled 2022-10-02: qty 1

## 2022-10-02 MED ORDER — SODIUM CHLORIDE 0.9 % IV BOLUS
1000.0000 mL | Freq: Once | INTRAVENOUS | Status: AC
Start: 2022-10-02 — End: 2022-10-02
  Administered 2022-10-02: 1000 mL via INTRAVENOUS

## 2022-10-02 MED ORDER — CHLORHEXIDINE GLUCONATE 0.12 % MT SOLN
15.0000 mL | Freq: Two times a day (BID) | OROMUCOSAL | 0 refills | Status: DC
Start: 2022-10-02 — End: 2023-01-19

## 2022-10-02 MED ORDER — CLINDAMYCIN HCL 300 MG PO CAPS: 300.0000 mg | ORAL_CAPSULE | Freq: Three times a day (TID) | ORAL | 0 refills | Status: AC

## 2022-10-02 MED ORDER — HYDROCODONE-ACETAMINOPHEN 5-325 MG PO TABS: 1.0000 | ORAL_TABLET | ORAL | 0 refills | Status: DC | PRN

## 2022-10-02 MED ORDER — IOHEXOL 350 MG/ML SOLN
75.0000 mL | Freq: Once | INTRAVENOUS | Status: AC | PRN
  Administered 2022-10-02: 75 mL via INTRAVENOUS

## 2022-10-02 NOTE — Discharge Instructions (Addendum)
Follow-up with your dentist on Monday, provided with dentist on-call if needed. Possible infection into the bone on CT, please discuss further with your dentist.  Discontinue the amoxicillin.  Start clindamycin, take until complete. Use Peridex oral solution to irrigate dental extraction site. Also recommend starting a probiotic.

## 2022-10-02 NOTE — ED Provider Notes (Signed)
[place] Provider Note   CSN: [id number] Arrival date & time: 10/02/22  1022     History  Chief Complaint  Patient presents with   Facial Swelling    Henry Quinn is a 22 y.o. male.  22 year old male presents with complaint of left-sided facial swelling.  Patient recent extraction on left on 09/24/2021, on amoxicillin and prednisone without improvement.  Denies fevers, trauma, drainage.  States pain is worse if he tries to eat something or chew.       Home Medications Prior to Admission medications   Medication Sig Start Date End Date Taking? Authorizing Provider  clindamycin (CLEOCIN) 300 MG capsule Take 1 capsule (300 mg total) by mouth 3 (three) times daily for 10 days. 10/02/22 10/12/22 Yes Tacy Learn, PA-C  HYDROcodone-acetaminophen (NORCO/VICODIN) 5-325 MG tablet Take 1 tablet by mouth every 4 (four) hours as needed. 10/02/22  Yes Tacy Learn, PA-C  amoxicillin (AMOXIL) 875 MG tablet SMARTSIG:1 Tablet(s) By Mouth 09/24/22   [provider]  Lidocaine (HM LIDOCAINE PATCH) 4 % PTCH Apply 1 patch topically every 12 (twelve) hours as needed. 11/08/21   Loni Beckwith, PA-C  methocarbamol (ROBAXIN) 500 MG tablet Take 1 tablet (500 mg total) by mouth every 8 (eight) hours as needed for muscle spasms. 11/08/21   Loni Beckwith, PA-C  predniSONE (DELTASONE) 20 MG tablet Take 2 tablets (40 mg total) by mouth daily. 09/25/22   Vanessa Kick, MD      Allergies    Patient has no known allergies.    Review of Systems   Review of Systems Negative except as per HPI Physical Exam Updated Vital Signs BP 128/82   Pulse 89   Temp 99.3 F (37.4 C) (Oral)   Resp 15   SpO2 100%  Physical Exam Vitals and nursing note reviewed.  Constitutional:      General: He is not in acute distress.    Appearance: He is well-developed. He is not diaphoretic.  HENT:     Head: Atraumatic.     Jaw: Trismus present.      Comments: Slight trismus, swelling along left side of mandible with tenderness, buccal mucosa appears edematous without fluctuance or focal collection evident, no drainage     Mouth/Throat:     Mouth: Mucous membranes are moist.  Eyes:     Conjunctiva/sclera: Conjunctivae normal.  Pulmonary:     Effort: Pulmonary effort is normal.  Musculoskeletal:     Cervical back: Neck supple.  Skin:    General: Skin is warm and dry.     Findings: No erythema or rash.  Neurological:     Mental Status: He is alert and oriented to person, place, and time.  Psychiatric:        Behavior: Behavior normal.     ED Results / Procedures / Treatments   Labs (all labs ordered are listed, but only abnormal results are displayed) Labs Reviewed  CBC WITH DIFFERENTIAL/PLATELET - Abnormal; Notable for the following components:      Result Value   WBC 12.1 (*)    Neutro Abs 8.8 (*)    Monocytes Absolute 1.3 (*)    All other components within normal limits  BASIC METABOLIC PANEL    EKG None  Radiology CT Maxillofacial W Contrast  Result Date: 10/02/2022 CLINICAL DATA:  Mastication paralysis/weakness (CN 5) Per chart review, patient tooth extracted recently. EXAM: CT MAXILLOFACIAL WITH CONTRAST TECHNIQUE: Multidetector CT imaging of the maxillofacial  structures was performed with intravenous contrast. Multiplanar CT image reconstructions were also generated. RADIATION DOSE REDUCTION: This exam was performed according to the departmental dose-optimization program which includes automated exposure control, adjustment of the mA and/or kV according to patient size and/or use of iterative reconstruction technique. CONTRAST:  60mL OMNIPAQUE IOHEXOL 350 MG/ML SOLN COMPARISON:  None Available. FINDINGS: Osseous: Left mandible findings are described below. No acute fracture. Orbits: Negative. No traumatic or inflammatory finding. Sinuses: Clear. Soft tissues: Peripherally enhancing fluid collection approximately 2.5 x  2.4 cm along the inferior and posterior left mandibular body (for example see series 3, image 24). Extensive surrounding edemaphlegmon. Nearby extracted/missing left posterior most mandibular molar. Surrounding asymmetric sclerosis of the mandible. Mildly prominent surrounding submandibular nodes, likely reactive. Limited intracranial: No significant or unexpected finding. IMPRESSION: 1. Findings compatible with a 2.5 cm abscess along the inferior and posterior left mandibular body. Extensive surrounding cellulitis/phlegmon. 2. Above findings are likely odontogenic given nearby extracted/missing left posterior most mandibular molar. Surrounding sclerosis of the mandible may represent chronic osteomyelitis. Electronically Signed   By: Margaretha Sheffield M.D.   On: 10/02/2022 14:38    Procedures Procedures    Medications Ordered in ED Medications  clindamycin (CLEOCIN) IVPB 900 mg (900 mg Intravenous New Bag/Given 10/02/22 1516)  iohexol (OMNIPAQUE) 350 MG/ML injection 75 mL (75 mLs Intravenous Contrast Given 10/02/22 1426)  sodium chloride 0.9 % bolus 1,000 mL (1,000 mLs Intravenous New Bag/Given 10/02/22 1515)    ED Course/ Medical Decision Making/ A&P                             Medical Decision Making Risk Prescription drug management.   22 year old male with left facial swelling and pain, recent dental extraction, no improvement with antibiotics and prednisone.  On exam, slight trismus, edematous appearance to buccal mucosa through the left cheek, no focal collection, no fluctuance appreciated.  Labs with mild leukocytosis.  Plan is for CT soft tissue for further evaluation.  Consider salivary gland disorder, parotitis, abscess or Ludwig's.  CT concerning for abscess, question chronic osteomyelitis.  Plan is for IV clindamycin and fluids while in the ER.  Discharged home on clindamycin with follow-up with patient's dentist.  Discussed with Dr. Walden Field, on-call with dentistry who states this  is likely a subperiosteal infection, can be treated by changing the antibiotics to clindamycin and using Peridex with a syringe and catheter for patient to irrigate his extraction site.  Patient to follow-up with his dentist on Monday.        Final Clinical Impression(s) / ED Diagnoses Final diagnoses:  Facial swelling  Dental abscess    Rx / DC Orders ED Discharge Orders          Ordered    clindamycin (CLEOCIN) 300 MG capsule  3 times daily        10/02/22 1520    HYDROcodone-acetaminophen (NORCO/VICODIN) 5-325 MG tablet  Every 4 hours PRN        10/02/22 1520              Roque Lias 10/02/22 1521    Wyvonnia Dusky, MD 10/02/22 1531

## 2022-10-02 NOTE — ED Provider Triage Note (Signed)
Emergency Medicine Provider Triage Evaluation Note  Henry Quinn , a 22 y.o. male  was evaluated in triage.  Pt complains of facial swelling for "a while" worsened last night.  He is been on amoxicillin and steroids since 09/24/2021 without improvement.  Has been having fevers, feels weak..  Review of Systems  Positive:  Negative:   Physical Exam  BP (!) 155/86 (BP Location: Right Arm)   Pulse 82   Temp 99.3 F (37.4 C) (Oral)   Resp 18   SpO2 97%  Gen:   Awake, no distress   Resp:  Normal effort  MSK:   Moves extremities without difficulty  Other:  Left-sided swelling maxillary, uvula is midline and tolerating secretions.  Medical Decision Making  Medically screening exam initiated at 11:13 AM.  Appropriate orders placed.  Henry Quinn was informed that the remainder of the evaluation will be completed by another provider, this initial triage assessment does not replace that evaluation, and the importance of remaining in the ED until their evaluation is complete.     Sherrill Raring, PA-C 10/02/22 1114

## 2022-10-02 NOTE — ED Triage Notes (Signed)
Pt reports getting tooth pulled 1 week ago. Pt stating that when he woke today the left side of his face is swollen. Pt taking amoxicillin and prednisone. Denies fevers.

## 2023-01-19 ENCOUNTER — Encounter (HOSPITAL_COMMUNITY): Payer: Self-pay | Admitting: Pharmacy Technician

## 2023-01-19 ENCOUNTER — Emergency Department (HOSPITAL_COMMUNITY)
Admission: EM | Admit: 2023-01-19 | Discharge: 2023-01-19 | Disposition: A | Discharge status: Home / Self Care | Payer: No Typology Code available for payment source | Attending: Emergency Medicine | Admitting: Emergency Medicine

## 2023-01-19 ENCOUNTER — Other Ambulatory Visit: Payer: Self-pay

## 2023-01-19 DIAGNOSIS — Y92410 Unspecified street and highway as the place of occurrence of the external cause: Secondary | ICD-10-CM | POA: Insufficient documentation

## 2023-01-19 DIAGNOSIS — S40011A Contusion of right shoulder, initial encounter: Secondary | ICD-10-CM | POA: Diagnosis not present

## 2023-01-19 DIAGNOSIS — R519 Headache, unspecified: Secondary | ICD-10-CM

## 2023-01-19 DIAGNOSIS — M25511 Pain in right shoulder: Secondary | ICD-10-CM | POA: Diagnosis present

## 2023-01-19 MED ORDER — NAPROXEN 500 MG PO TABS: 500.0000 mg | ORAL_TABLET | Freq: Two times a day (BID) | ORAL | 0 refills | Status: AC
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
## 2023-01-19 NOTE — Discharge Instructions (Signed)
Please take Naprosyn, 500mg by mouth twice daily as needed for pain - this in an antiinflammatory medicine (NSAID) and is similar to ibuprofen - many people feel that it is stronger than ibuprofen and it is easier to take since it is a smaller pill.  Please use this only for 1 week - if your pain persists, you will need to follow up with your doctor in the office for ongoing guidance and pain control.    Thank you for allowing us to treat you in the emergency department today.  After reviewing your examination and potential testing that was done it appears that you are safe to go home.  I would like for you to follow-up with your doctor within the next several days, have them obtain your results and follow-up with them to review all of these tests.  If you should develop severe or worsening symptoms return to the emergency department immediately 
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
## 2023-11-11 ENCOUNTER — Emergency Department (HOSPITAL_COMMUNITY): Payer: Self-pay

## 2023-11-11 ENCOUNTER — Other Ambulatory Visit: Payer: Self-pay

## 2023-11-11 ENCOUNTER — Emergency Department (HOSPITAL_COMMUNITY)
Admission: EM | Admit: 2023-11-11 | Discharge: 2023-11-11 | Disposition: A | Discharge status: Home / Self Care | Payer: Self-pay | Attending: Emergency Medicine | Admitting: Emergency Medicine

## 2023-11-11 DIAGNOSIS — G44009 Cluster headache syndrome, unspecified, not intractable: Secondary | ICD-10-CM

## 2023-11-11 MED ORDER — KETOROLAC TROMETHAMINE 15 MG/ML IJ SOLN
15.0000 mg | Freq: Once | INTRAMUSCULAR | Status: AC
  Administered 2023-11-11: 15 mg via INTRAVENOUS
  Filled 2023-11-11: qty 1

## 2023-11-11 MED ORDER — METOCLOPRAMIDE HCL 5 MG/ML IJ SOLN
10.0000 mg | Freq: Once | INTRAMUSCULAR | Status: AC
  Administered 2023-11-11: 10 mg via INTRAVENOUS
  Filled 2023-11-11: qty 2

## 2023-11-11 MED ORDER — DIPHENHYDRAMINE HCL 50 MG/ML IJ SOLN
12.5000 mg | Freq: Once | INTRAMUSCULAR | Status: AC
  Administered 2023-11-11: 12.5 mg via INTRAVENOUS
  Filled 2023-11-11: qty 1

## 2023-11-11 MED ORDER — SODIUM CHLORIDE 0.9 % IV BOLUS
500.0000 mL | Freq: Once | INTRAVENOUS | Status: AC
  Administered 2023-11-11: 500 mL via INTRAVENOUS
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
## 2023-11-11 NOTE — Discharge Instructions (Addendum)
 As discussed, your imaging is reassuring. Alternate between Ibuprofen 600 mg and Tylenol 650 mg as needed for headaches.   Follow up with your PCP in the next week for reevaluation of your symptoms.  Get help right away if: You faint. You have weakness or lose feeling (have numbness) on one side of your body or face. You see two of everything (double vision). You vomit or feel you may vomit (nauseous), and it does not stop after many hours. You have trouble with your balance or with walking. You have trouble talking. You have neck pain or stiffness and you have a fever.
